# Patient Record
Sex: Female | Born: 1990 | Race: Black or African American | Hispanic: No | Marital: Single | Smoking: Former smoker
Health system: Southern US, Community
[De-identification: ages and names within clinical notes are randomized; demographics above are authoritative.]

## PROBLEM LIST (undated history)

## (undated) DIAGNOSIS — Z889 Allergy status to unspecified drugs, medicaments and biological substances status: Secondary | ICD-10-CM

## (undated) DIAGNOSIS — J45909 Unspecified asthma, uncomplicated: Secondary | ICD-10-CM

## (undated) HISTORY — PX: WISDOM TOOTH EXTRACTION: SHX21

---

## 2014-12-24 ENCOUNTER — Emergency Department (HOSPITAL_BASED_OUTPATIENT_CLINIC_OR_DEPARTMENT_OTHER): Admission: EM | Admit: 2014-12-24 | Discharge: 2014-12-24 | Discharge status: Absent without leave | Payer: Self-pay

## 2015-08-30 ENCOUNTER — Emergency Department (HOSPITAL_COMMUNITY): Payer: Medicaid Other

## 2015-08-30 ENCOUNTER — Emergency Department (HOSPITAL_BASED_OUTPATIENT_CLINIC_OR_DEPARTMENT_OTHER)
Admission: EM | Admit: 2015-08-30 | Discharge: 2015-08-31 | Disposition: A | Discharge status: Other Acute Inpatient Hospital | Payer: Medicaid Other | Attending: Emergency Medicine | Admitting: Emergency Medicine

## 2015-08-30 ENCOUNTER — Encounter (HOSPITAL_BASED_OUTPATIENT_CLINIC_OR_DEPARTMENT_OTHER): Payer: Self-pay | Admitting: *Deleted

## 2015-08-30 DIAGNOSIS — Z3A00 Weeks of gestation of pregnancy not specified: Secondary | ICD-10-CM | POA: Diagnosis not present

## 2015-08-30 DIAGNOSIS — O209 Hemorrhage in early pregnancy, unspecified: Secondary | ICD-10-CM

## 2015-08-30 HISTORY — DX: Unspecified asthma, uncomplicated: J45.909

## 2015-08-30 HISTORY — DX: Allergy status to unspecified drugs, medicaments and biological substances: Z88.9

## 2015-08-30 LAB — CBC WITH DIFFERENTIAL/PLATELET
BASOS ABS: 0.1 10*3/uL (ref 0.0–0.1)
Basophils Relative: 1 %
Eosinophils Absolute: 0.1 10*3/uL (ref 0.0–0.7)
Eosinophils Relative: 1 %
HEMATOCRIT: 42.8 % (ref 36.0–46.0)
Hemoglobin: 14.3 g/dL (ref 12.0–15.0)
LYMPHS PCT: 33 %
Lymphs Abs: 1.9 10*3/uL (ref 0.7–4.0)
MCH: 30.2 pg (ref 26.0–34.0)
MCHC: 33.4 g/dL (ref 30.0–36.0)
MCV: 90.5 fL (ref 78.0–100.0)
Monocytes Absolute: 0.7 10*3/uL (ref 0.1–1.0)
Monocytes Relative: 12 %
NEUTROS ABS: 3 10*3/uL (ref 1.7–7.7)
NEUTROS PCT: 53 %
PLATELETS: 277 10*3/uL (ref 150–400)
RBC: 4.73 MIL/uL (ref 3.87–5.11)
RDW: 12.2 % (ref 11.5–15.5)
WBC: 5.6 10*3/uL (ref 4.0–10.5)

## 2015-08-30 LAB — WET PREP, GENITAL
Sperm: NONE SEEN
Trich, Wet Prep: NONE SEEN
WBC WET PREP: NONE SEEN
YEAST WET PREP: NONE SEEN

## 2015-08-30 LAB — URINALYSIS, ROUTINE W REFLEX MICROSCOPIC
Bilirubin Urine: NEGATIVE
Glucose, UA: NEGATIVE mg/dL
Ketones, ur: 15 mg/dL — AB
LEUKOCYTES UA: NEGATIVE
Nitrite: NEGATIVE
PROTEIN: NEGATIVE mg/dL
Specific Gravity, Urine: 1.027 (ref 1.005–1.030)
pH: 6 (ref 5.0–8.0)

## 2015-08-30 LAB — PREGNANCY, URINE: PREG TEST UR: POSITIVE — AB

## 2015-08-30 LAB — URINE MICROSCOPIC-ADD ON: WBC, UA: NONE SEEN WBC/hpf (ref 0–5)

## 2015-08-30 LAB — ABO/RH: ABO/RH(D): A POS

## 2015-08-30 LAB — HCG, QUANTITATIVE, PREGNANCY: HCG, BETA CHAIN, QUANT, S: 1585 m[IU]/mL — AB (ref <5)

## 2015-08-30 NOTE — ED Notes (Signed)
Pt had +preg test on 11/2 with LMP 07/24/2015 at [facility]- reports she woke up today with tan colored discharge- when she went to BR this afternoon around 1700 she wiped and had BRB- Pt is wearing a pantiliner that she has not had to change yet- denies pain

## 2015-08-30 NOTE — ED Notes (Signed)
Pt reports went to MD and found out she was 3weeks 1 day pregnant on Nov 2nd.  Woke up today with tan discharge and then around 5pm today started having bright red blood.  Reports cramping.

## 2015-08-30 NOTE — ED Provider Notes (Signed)
CSN: [id number]     Arrival date & time 08/30/15  1944 History  By signing my name below, I, Octavia Heir, attest that this documentation has been prepared under the direction and in the presence of Tilden Fossa, MD. Electronically Signed: Octavia Heir, ED Scribe. 08/30/2015. 8:52 PM.      Chief Complaint  Patient presents with  . Vaginal Bleeding    pregnant     The history is provided by the patient. No language interpreter was used.   HPI Comments: Carol Willis is a 24 y.o. female who is [redacted]wks pregnant presents to the Emergency Department complaining of constant, gradual worsening vaginal bleeding onset this morning. Pt reports she woke up this evening with tan colored discharge that progressively turned into bright red blood. She reports having some abdominal cramping this evening when giving a urine sample. This is the pt's second pregnancy. Pt states the first day of her last period was 10/11. Pt denies vomiting and diarrhea.  Past Medical History  Diagnosis Date  . Asthma   . Multiple allergies    Past Surgical History  Procedure Laterality Date  . Wisdom tooth extraction     No family history on file. Social History  Substance Use Topics  . Smoking status: Former Smoker    Quit date: 08/11/2015  . Smokeless tobacco: Never Used  . Alcohol Use: No   OB History    Gravida Para Term Preterm AB TAB SAB Ectopic Multiple Living   1              Review of Systems  Gastrointestinal: Positive for abdominal pain. Negative for vomiting and diarrhea.  Genitourinary: Positive for vaginal bleeding.  All other systems reviewed and are negative.     Allergies  Review of patient's allergies indicates no known allergies.  Home Medications   Prior to Admission medications   Not on File   Triage vitals: BP 117/77 mmHg  Pulse 83  Temp(Src) 98.6 F (37 C) (Oral)  Resp 16  Ht  (1.499 m)  Wt 175 lb (79.379 kg)  BMI 35.33 kg/m2  SpO2 100%  LMP  07/19/2015 Physical Exam  Constitutional: She is oriented to person, place, and time. She appears well-developed and well-nourished.  HENT:  Head: Normocephalic and atraumatic.  Cardiovascular: Normal rate and regular rhythm.   No murmur heard. Pulmonary/Chest: Effort normal and breath sounds normal. No respiratory distress.  Abdominal: Soft. There is no tenderness. There is no rebound and no guarding.  Genitourinary:  Pelvic exam: moderate amount of blood, os closed   Musculoskeletal: She exhibits no edema or tenderness.  Neurological: She is alert and oriented to person, place, and time.  Skin: Skin is warm and dry.  Psychiatric: She has a normal mood and affect. Her behavior is normal.  Nursing note and vitals reviewed.   ED Course  Procedures  DIAGNOSTIC STUDIES: Oxygen Saturation is 100% on RA, normal by my interpretation.  COORDINATION OF CARE:  8:49 PM Discussed treatment plan which includes pelvic exam and urinalysis with pt at bedside and pt agreed to plan.  Labs Review Labs Reviewed  PREGNANCY, URINE - Abnormal; Notable for the following:    Preg Test, Ur POSITIVE (*)    All other components within normal limits    Imaging Review No results found. I have personally reviewed and evaluated these images and lab results as part of my medical decision-making.   EKG Interpretation None      MDM   Final  diagnoses:  Vaginal bleeding in pregnancy, first trimester   Pt here for evaluation of vaginal bleeding, LMP 07/19/15.  There is mild to moderate blood present on pelvic exam with no clots, no tissue on OS, no CMT.  [facility] to further evaluate.  Pt instructed to present directly to [facility] for further work up.     Tilden FossaElizabeth Randal Yepiz, MD 08/31/15 [phone]

## 2015-08-31 NOTE — ED Provider Notes (Signed)
CSN: [id number]     Arrival date & time 08/30/15  1944 History   First MD Initiated Contact with Patient 08/30/15 2039     Chief Complaint  Patient presents with  . Vaginal Bleeding    pregnant     (Consider location/radiation/quality/duration/timing/severity/associated sxs/prior Treatment) Patient is a 24 y.o. female presenting with vaginal bleeding. The history is provided by the patient. No language interpreter was used.  Vaginal Bleeding Severity:  Moderate Onset quality:  Gradual Timing:  Constant Progression:  Worsening Chronicity:  New Menstrual history:  Missed period Possible pregnancy: no   Relieved by:  Nothing Worsened by:  Nothing tried Ineffective treatments:  None tried Associated symptoms: no abdominal pain    [facility] and sent here for an ultrasound. Past Medical History  Diagnosis Date  . Asthma   . Multiple allergies    Past Surgical History  Procedure Laterality Date  . Wisdom tooth extraction     No family history on file. Social History  Substance Use Topics  . Smoking status: Former Smoker    Quit date: 08/11/2015  . Smokeless tobacco: Never Used  . Alcohol Use: No   OB History    Gravida Para Term Preterm AB TAB SAB Ectopic Multiple Living   1              Review of Systems  Gastrointestinal: Negative for abdominal pain.  Genitourinary: Positive for vaginal bleeding.  All other systems reviewed and are negative.     Allergies  Review of patient's allergies indicates no known allergies.  Home Medications   Prior to Admission medications   Medication Sig Start Date End Date Taking? Authorizing Provider  Prenatal Vit-Fe Fumarate-FA (PRENATAL MULTIVITAMIN) TABS tablet Take 1 tablet by mouth daily at 12 noon.   Yes Historical Provider, MD   BP 142/85 mmHg  Pulse 69  Temp(Src) 98.1 F (36.7 C) (Oral)  Resp 14  Ht  (1.499 m)  Wt 79.379 kg  BMI 35.33 kg/m2  SpO2 100%  LMP 07/19/2015 Physical  Exam  Constitutional: She is oriented to person, place, and time. She appears well-developed and well-nourished.  HENT:  Head: Normocephalic.  Eyes: EOM are normal.  Neck: Normal range of motion.  Cardiovascular: Normal rate.   Pulmonary/Chest: Effort normal.  Abdominal: Soft. She exhibits no distension.  Musculoskeletal: Normal range of motion.  Neurological: She is alert and oriented to person, place, and time.  Psychiatric: She has a normal mood and affect.  Nursing note and vitals reviewed.   ED Course  Procedures (including critical care time) Labs Review Labs Reviewed  WET PREP, GENITAL - Abnormal; Notable for the following:    Clue Cells Wet Prep HPF POC PRESENT (*)    All other components within normal limits  PREGNANCY, URINE - Abnormal; Notable for the following:    Preg Test, Ur POSITIVE (*)    All other components within normal limits  URINALYSIS, ROUTINE W REFLEX MICROSCOPIC (NOT AT [facility]) - Abnormal; Notable for the following:    Hgb urine dipstick MODERATE (*)    Ketones, ur 15 (*)    All other components within normal limits  HCG, QUANTITATIVE, PREGNANCY - Abnormal; Notable for the following:    hCG, Beta Chain, Quant, S 1585 (*)    All other components within normal limits  URINE MICROSCOPIC-ADD ON - Abnormal; Notable for the following:    Squamous Epithelial / LPF 0-5 (*)    Bacteria, UA  RARE (*)    All other components within normal limits  CBC WITH DIFFERENTIAL/PLATELET  HIV ANTIBODY (ROUTINE TESTING)  ABO/RH  GC/CHLAMYDIA PROBE AMP ([place]) NOT AT [facility]    Imaging Review US Ob Comp Less 14 Wks  08/30/2015  CLINICAL DATA:  Positive pregnancy test. Cramping. Bright red blood when wiping. Estimated gestational age by LMP is 6 weeks 0 days. Quantitative beta HCG is 1,585. EXAM: OBSTETRIC <14 WK [place] AND TRANSVAGINAL OB US TECHNIQUE: Both transabdominal and transvaginal ultrasound examinations were performed for complete evaluation of the gestation as  well as the maternal uterus, adnexal regions, and pelvic cul-de-sac. Transvaginal technique was performed to assess early pregnancy. COMPARISON:  None. FINDINGS: Intrauterine gestational sac: No intrauterine gestational sac identified. Yolk sac:  Not identified Embryo:  Not identified Cardiac Activity: Not identified. Maternal uterus/adnexae: The uterus is anteverted and measures 7.9 by 3.8 by 6 cm. Endometrial stripe thickness appears normal at 5 mm. No abnormal endometrial fluid collections. Small nabothian cysts in the cervix. Exophytic lesions are demonstrated off of the uterine fundus, right measuring 2.2 x 2.2 x 2.7 cm and left measuring 2.7 x 2 x 1.9 cm. These likely represent exophytic fibroids. Both ovaries are identified. Right ovary measures 3.6 x 2.3 x 2.5 cm. Left ovary measures 2.8 x 1.8 x 2.4 cm. Normal follicular changes are demonstrated. No abnormal adnexal masses. Small amount of free fluid in the pelvis. IMPRESSION: No intrauterine gestational sac, yolk sac, or fetal pole identified. Differential considerations include intrauterine pregnancy too early to be sonographically visualized, missed abortion, or ectopic pregnancy. Followup ultrasound is recommended in 10-14 days for further evaluation. 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 Small amount of free fluid in the pelvis. Electronically Signed   By: Burman Nieves M.D.   On: 08/30/2015 23:47   US Ob Transvaginal  08/30/2015  CLINICAL DATA:  Positive pregnancy test. Cramping. Bright red blood when wiping. Estimated gestational age by LMP is 6 weeks 0 days. Quantitative beta HCG is 1,585. EXAM: OBSTETRIC <14 WK [place] AND TRANSVAGINAL OB US TECHNIQUE: Both transabdominal and transvaginal ultrasound examinations were performed for complete evaluation of the gestation as well as the maternal uterus, adnexal regions, and pelvic cul-de-sac. Transvaginal technique was performed to assess early pregnancy. COMPARISON:  None. FINDINGS:  Intrauterine gestational sac: No intrauterine gestational sac identified. Yolk sac:  Not identified Embryo:  Not identified Cardiac Activity: Not identified. Maternal uterus/adnexae: The uterus is anteverted and measures 7.9 by 3.8 by 6 cm. Endometrial stripe thickness appears normal at 5 mm. No abnormal endometrial fluid collections. Small nabothian cysts in the cervix. Exophytic lesions are demonstrated off of the uterine fundus, right measuring 2.2 x 2.2 x 2.7 cm and left measuring 2.7 x 2 x 1.9 cm. These likely represent exophytic fibroids. Both ovaries are identified. Right ovary measures 3.6 x 2.3 x 2.5 cm. Left ovary measures 2.8 x 1.8 x 2.4 cm. Normal follicular changes are demonstrated. No abnormal adnexal masses. Small amount of free fluid in the pelvis. IMPRESSION: No intrauterine gestational sac, yolk sac, or fetal pole identified. Differential considerations include intrauterine pregnancy too early to be sonographically visualized, missed abortion, or ectopic pregnancy. Followup ultrasound is recommended in 10-14 days for further evaluation. 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 Small amount of free fluid in the pelvis. Electronically Signed   By: Burman Nieves M.D.   On: 08/30/2015 23:47   I have personally reviewed and evaluated these images and lab results as part of my medical decision-making.  EKG Interpretation None      MDM Ultrasound shows no gestation sac. Radiologist advised need for 10 day repeat ultrasound.  Pt is A positive.    Final diagnoses:  Vaginal bleeding in pregnancy, first trimester    [facility]   [facility].  [facility] any problems.    Lonia SkinnerLeslie K Prices ForkSofia, PA-C 08/31/15 0016  Laurence Spatesachel Morgan Little, MD 08/31/15 [phone]

## 2015-08-31 NOTE — Discharge Instructions (Signed)

## 2015-09-01 LAB — HIV ANTIBODY (ROUTINE TESTING W REFLEX): HIV Screen 4th Generation wRfx: NONREACTIVE

## 2015-09-01 LAB — GC/CHLAMYDIA PROBE AMP (~~LOC~~) NOT AT ARMC
Chlamydia: NEGATIVE
Neisseria Gonorrhea: NEGATIVE

## 2016-02-07 IMAGING — US US OB TRANSVAGINAL
1 series · 13 of 28 positions shown · non-contrast
Comparison: None.

CLINICAL DATA: Positive pregnancy test. Cramping. Bright red blood
when wiping. Estimated gestational age by LMP is 6 weeks 0 days.
Quantitative beta HCG is [DATE].

EXAM:
OBSTETRIC <14 WK US AND TRANSVAGINAL OB US
TECHNIQUE: Both transabdominal and transvaginal ultrasound examinations were
performed for complete evaluation of the gestation as well as the
maternal uterus, adnexal regions, and pelvic cul-de-sac.
Transvaginal technique was performed to assess early pregnancy.

[Series 1: us ob transvaginal · 0.20mm/px · 64 acquisitions, 13 frames shown]
[im 3/64]
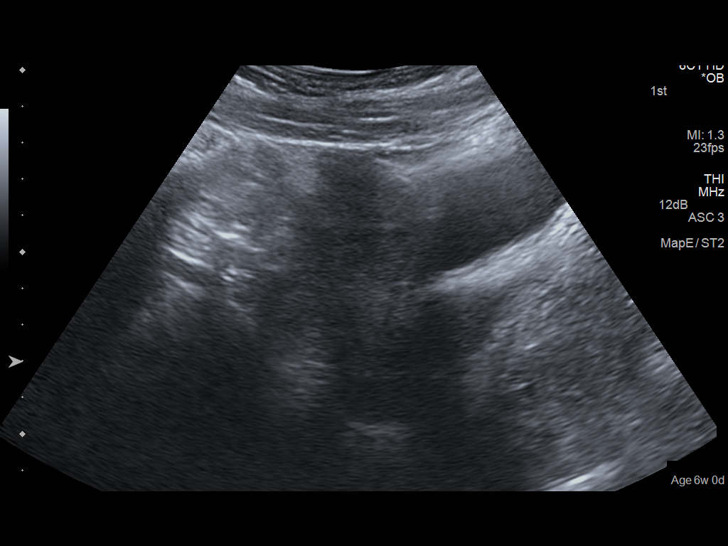
[im 8/64]
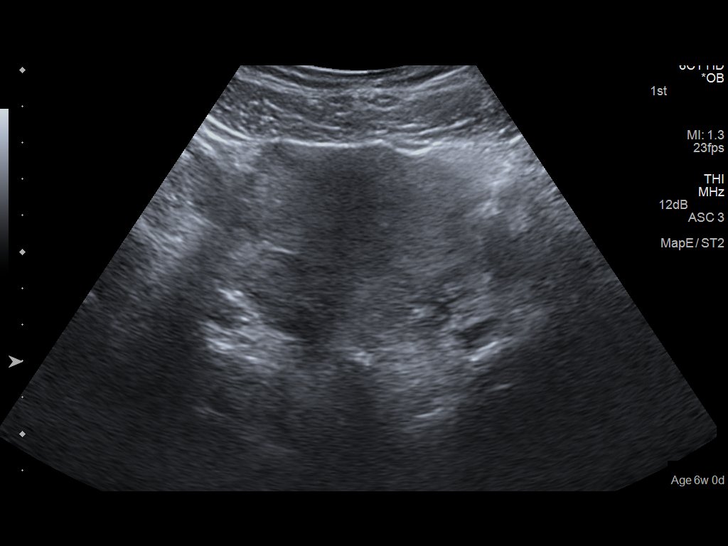
[im 12/64]
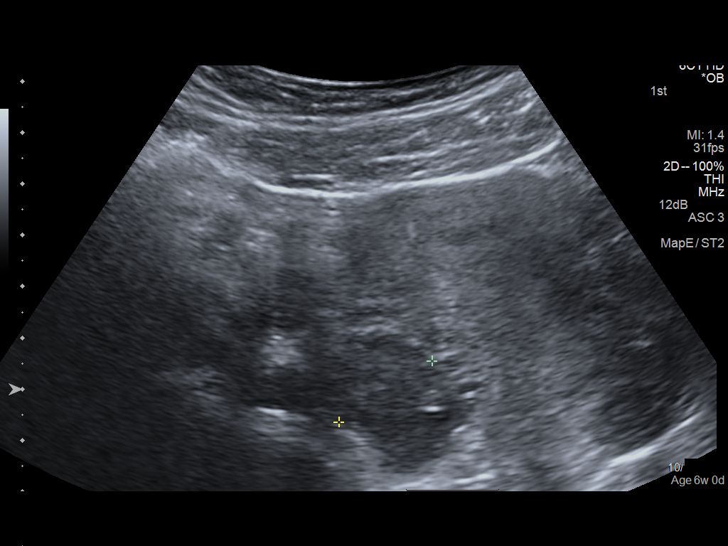
[im 17/64]
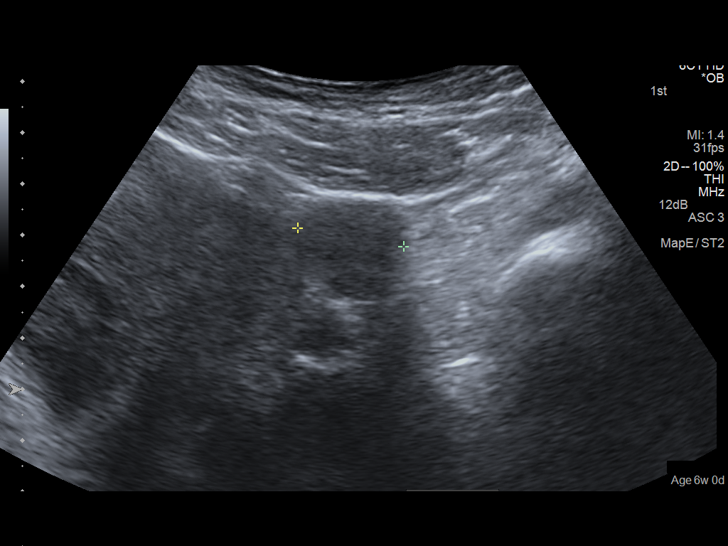
[im 22/64]
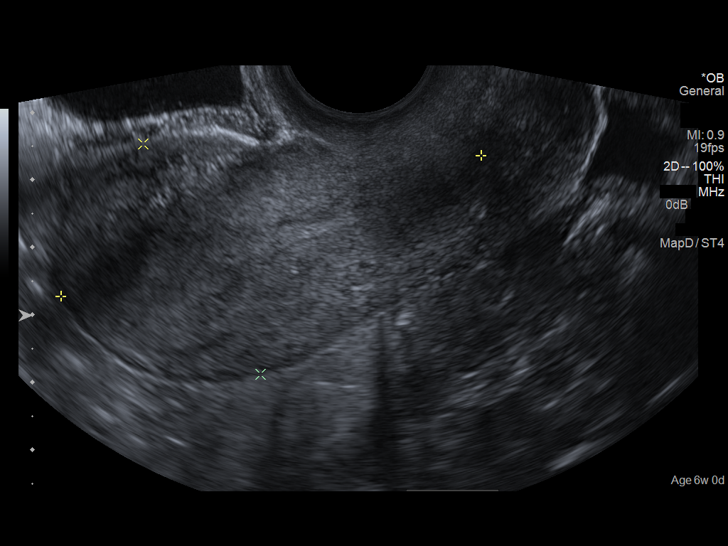
[im 26/64]
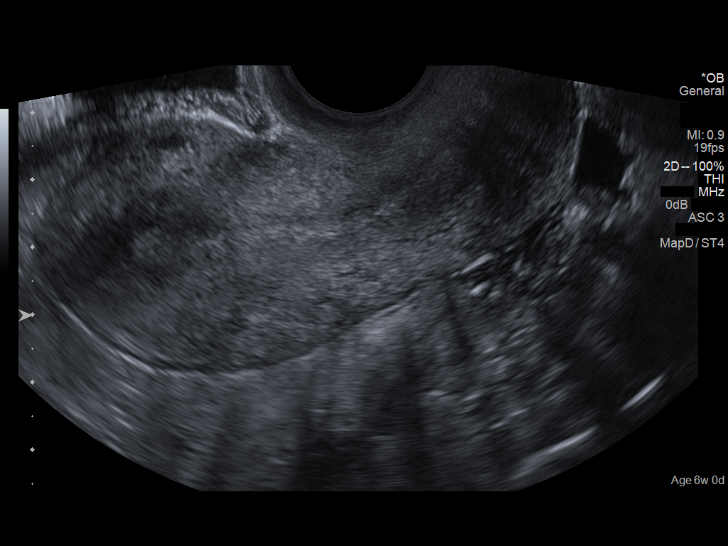
[im 33/64]
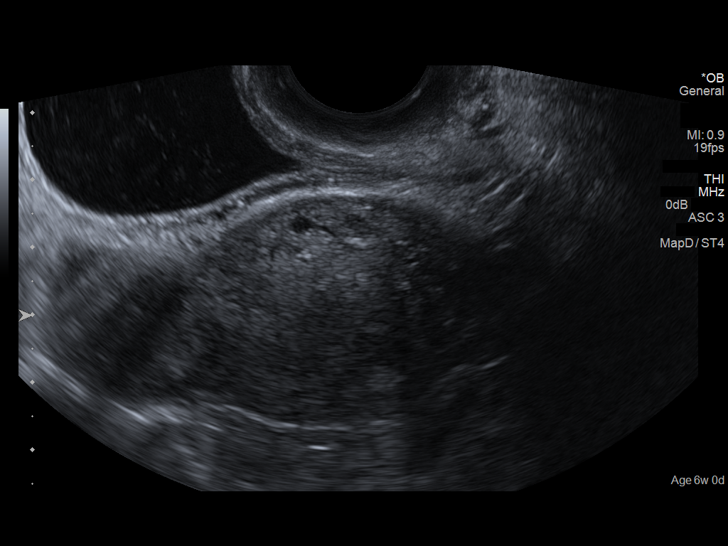
[im 38/64]
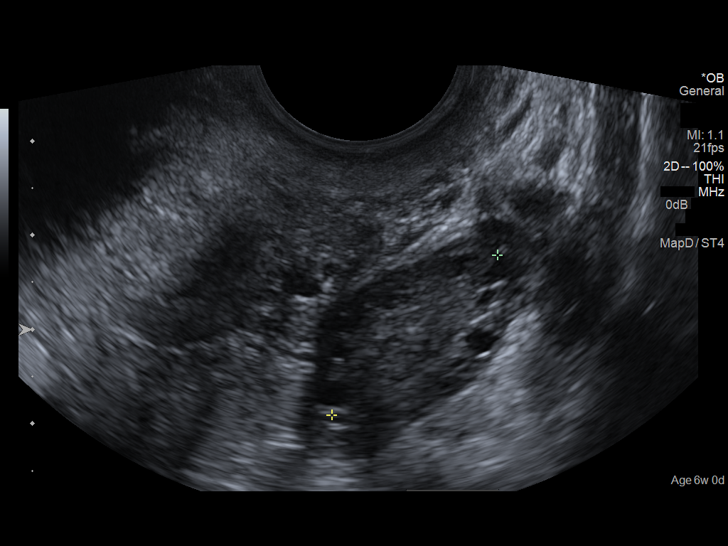
[im 43/64]
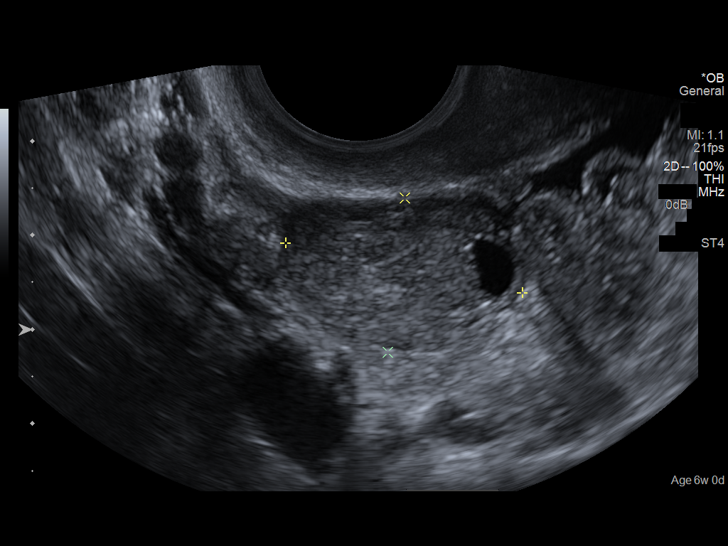
[im 47/64]
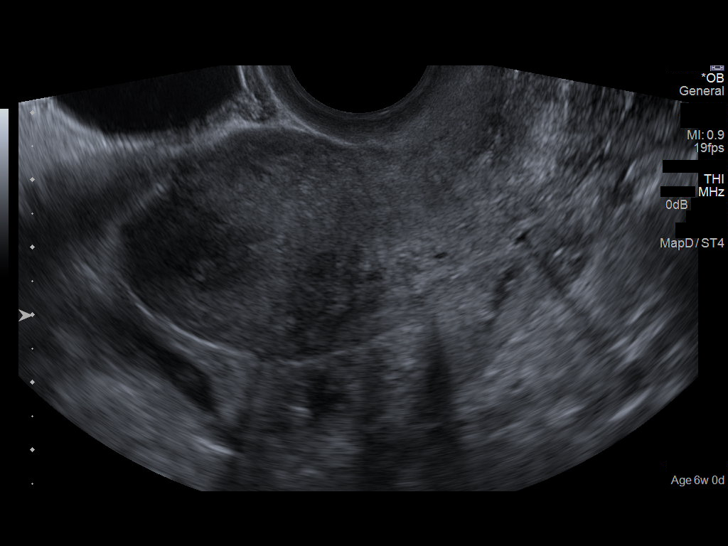
[im 52/64]
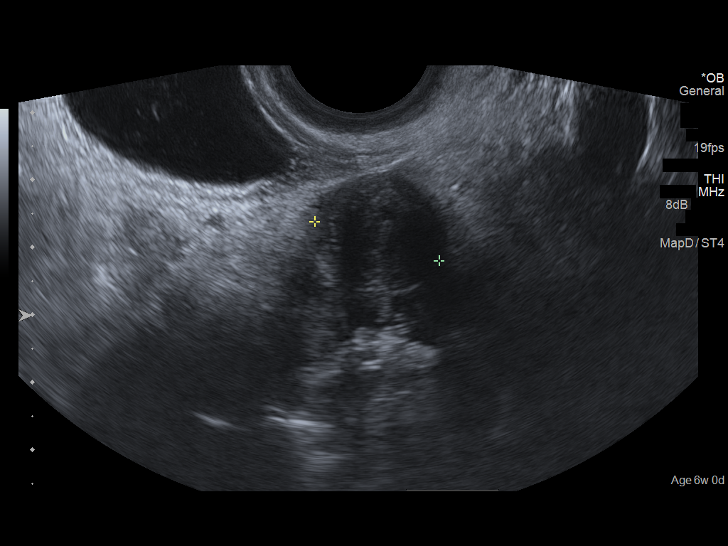
[im 57/64]
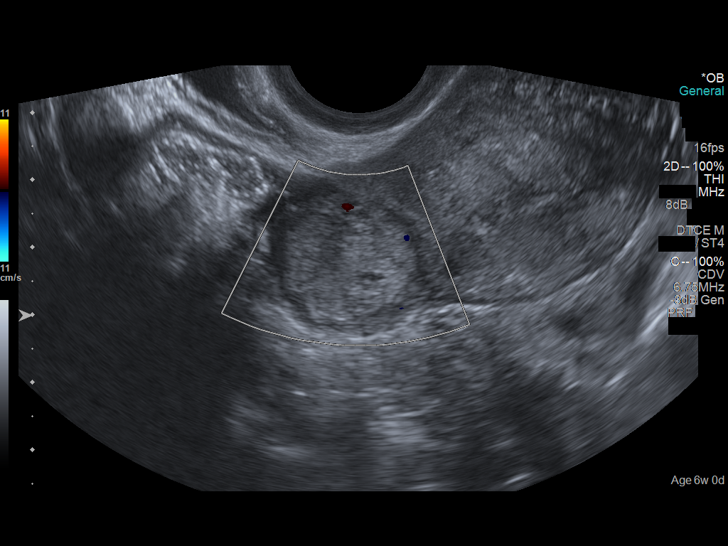
[im 61/64]
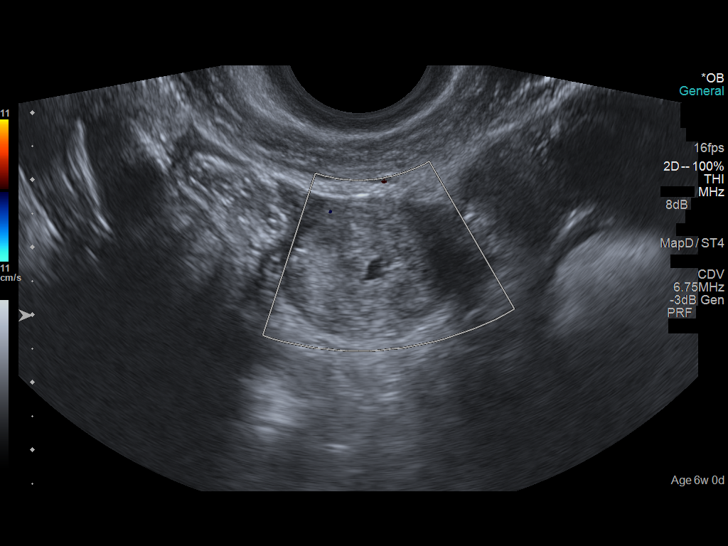

[13 of 28 positions shown; findings below may reference images not displayed]

FINDINGS: Intrauterine gestational sac: No intrauterine gestational sac
identified.

Yolk sac:  Not identified

Embryo:  Not identified

Cardiac Activity: Not identified.

Maternal uterus/adnexae: The uterus is anteverted and measures
by 3.8 by 6 cm. Endometrial stripe thickness appears normal at 5 mm.
No abnormal endometrial fluid collections. Small nabothian cysts in
the cervix. Exophytic lesions are demonstrated off of the uterine
fundus, right measuring 2.2 x 2.2 x 2.7 cm and left measuring 2.7 x
2 x 1.9 cm. These likely represent exophytic fibroids.

Both ovaries are identified. Right ovary measures 3.6 x 2.3 x
cm. Left ovary measures 2.8 x 1.8 x 2.4 cm. Normal follicular
changes are demonstrated. No abnormal adnexal masses.

Small amount of free fluid in the pelvis.
IMPRESSION: No intrauterine gestational sac, yolk sac, or fetal pole identified.
Differential considerations include intrauterine pregnancy too early
to be sonographically visualized, missed abortion, or ectopic
pregnancy. Followup ultrasound is recommended in 10-14 days for
further evaluation. Probable bilateral exophytic uterine fibroids.
Small amount of free fluid in the pelvis.

## 2016-10-29 ENCOUNTER — Encounter (HOSPITAL_BASED_OUTPATIENT_CLINIC_OR_DEPARTMENT_OTHER): Payer: Self-pay | Admitting: *Deleted

## 2016-10-29 ENCOUNTER — Emergency Department (HOSPITAL_BASED_OUTPATIENT_CLINIC_OR_DEPARTMENT_OTHER)
Admission: EM | Admit: 2016-10-29 | Discharge: 2016-10-30 | Disposition: A | Discharge status: Home / Self Care | Payer: Medicaid Other | Attending: Emergency Medicine | Admitting: Emergency Medicine

## 2016-10-29 DIAGNOSIS — Z3A30 30 weeks gestation of pregnancy: Secondary | ICD-10-CM | POA: Diagnosis not present

## 2016-10-29 DIAGNOSIS — Z87891 Personal history of nicotine dependence: Secondary | ICD-10-CM | POA: Diagnosis not present

## 2016-10-29 DIAGNOSIS — R109 Unspecified abdominal pain: Secondary | ICD-10-CM

## 2016-10-29 DIAGNOSIS — J45909 Unspecified asthma, uncomplicated: Secondary | ICD-10-CM | POA: Diagnosis not present

## 2016-10-29 DIAGNOSIS — R1030 Lower abdominal pain, unspecified: Secondary | ICD-10-CM | POA: Diagnosis not present

## 2016-10-29 DIAGNOSIS — O26893 Other specified pregnancy related conditions, third trimester: Secondary | ICD-10-CM | POA: Diagnosis present

## 2016-10-29 DIAGNOSIS — M545 Low back pain: Secondary | ICD-10-CM | POA: Insufficient documentation

## 2016-10-29 LAB — URINALYSIS, ROUTINE W REFLEX MICROSCOPIC
Bilirubin Urine: NEGATIVE
GLUCOSE, UA: NEGATIVE mg/dL
HGB URINE DIPSTICK: NEGATIVE
Ketones, ur: NEGATIVE mg/dL
Nitrite: NEGATIVE
Protein, ur: NEGATIVE mg/dL
SPECIFIC GRAVITY, URINE: 1.027 (ref 1.005–1.030)
pH: 6.5 (ref 5.0–8.0)

## 2016-10-29 LAB — URINALYSIS, MICROSCOPIC (REFLEX): RBC / HPF: NONE SEEN RBC/hpf (ref 0–5)

## 2016-10-29 MED ORDER — SODIUM CHLORIDE 0.9 % IV BOLUS (SEPSIS)
1000.0000 mL | Freq: Once | INTRAVENOUS | Status: AC
Start: 2016-10-30 — End: 2016-10-30
  Administered 2016-10-29: 1000 mL via INTRAVENOUS

## 2016-10-29 MED ORDER — ACETAMINOPHEN 500 MG PO TABS
1000.0000 mg | ORAL_TABLET | Freq: Once | ORAL | Status: AC
Start: 2016-10-30 — End: 2016-10-30
  Administered 2016-10-30: 1000 mg via ORAL
  Filled 2016-10-29: qty 2

## 2016-10-29 NOTE — ED Provider Notes (Signed)
By signing my name below, I, Carol Willis, attest that this documentation has been prepared under the direction and in the presence of Kristen N Ward, DO. Electronically signed, Carol Willis, ED Scribe. 10/29/16. 11:47 PM.  TIME SEEN: 11:43 AM  CHIEF COMPLAINT: Abdominal pain  HPI:  HPI Comments: Carol Willis is a 26 y.o. female G2P1 who presents to the Emergency Department complaining of gradual onset, worsening central abdominal pain that started this morning upon waking. Pt states that this pain has gradually worsened throughout the day today. [facility]. This is the pt's second pregnancy. No complications with her first pregnancy. She is due April 2nd. She is 25 weeks and 5 days based on this due date. No fever, chills, nausea, vomiting, diarrhea. No dysuria, hematuria. No abnormal vaginal bleeding or discharge. Has not noticed any contractions. Has been feeling her baby move. Has an appointment with her OB/GYN on 11/01/16. No complications with this pregnancy.  ROS: See HPI Constitutional: no fever  Eyes: no drainage  ENT: no runny nose   Cardiovascular:  no chest pain  Resp: no SOB  GI: no vomiting GU: no dysuria Integumentary: no rash  Allergy: no hives  Musculoskeletal: no leg swelling  Neurological: no slurred speech ROS otherwise negative  PAST MEDICAL HISTORY/PAST SURGICAL HISTORY:  Past Medical History:  Diagnosis Date  . Asthma   . Multiple allergies     MEDICATIONS:  Prior to Admission medications   Medication Sig Start Date End Date Taking? Authorizing Provider  Prenatal Vit-Fe Fumarate-FA (PRENATAL MULTIVITAMIN) TABS tablet Take 1 tablet by mouth daily at 12 noon.    Historical Provider, MD    ALLERGIES:  No Known Allergies  SOCIAL HISTORY:  Social History  Substance Use Topics  . Smoking status: Former Smoker    Quit date: 08/11/2015  . Smokeless tobacco: Never Used  . Alcohol use No    FAMILY HISTORY: History reviewed. No pertinent  family history.  EXAM: BP 96/62 (BP Location: Left Arm)   Pulse 99   Temp 98.1 F (36.7 C) (Oral)   Resp 16   Ht 4\' 11"  (1.499 m)   Wt 216 lb (98 kg)   LMP 03/29/2016   SpO2 99%   BMI 43.63 kg/m  CONSTITUTIONAL: Alert and oriented and responds appropriately to questions. Well-appearing; well-nourished HEAD: Normocephalic EYES: Conjunctivae clear, PERRL, EOMI ENT: normal nose; no rhinorrhea; moist mucous membranes NECK: Supple, no meningismus, no nuchal rigidity, no LAD  CARD: RRR; S1 and S2 appreciated; no murmurs, no clicks, no rubs, no gallops RESP: Normal chest excursion without splinting or tachypnea; breath sounds clear and equal bilaterally; no wheezes, no rhonchi, no rales, no hypoxia or respiratory distress, speaking full sentences ABD/GI: Normal bowel sounds; gravid uterus approximately 10 cm above the level of umbilicus, soft, non-tender, no rebound, no guarding, no peritoneal signs, no hepatosplenomegaly.  GU:  Normal external genitalia, cervix is thick, closed and high. No significant vaginal discharge or bleeding. No leaking fluid. BACK:  The back appears normal and is non-tender to palpation, there is no CVA tenderness EXT: Normal ROM in all joints; non-tender to palpation; no edema; normal capillary refill; no cyanosis, no calf tenderness or swelling    SKIN: Normal color for age and race; warm; no rash NEURO: Moves all extremities equally, sensation to light touch intact diffusely, cranial nerves II through XII intact, normal speech PSYCH: The patient's mood and manner are appropriate. Grooming and personal hygiene are appropriate.  MEDICAL DECISION MAKING:  Patient here with abdominal and lower back pain while pregnant. Urine shows small leukocytes but also a few bacteria and many squamous cells. Suspect dirty catch. No other sign of infection. She has been monitored on tocometry and we see no sign of contractions, fetal abnormality. There is normal fetal movement and  normal fetal heart rate. Cervix is closed, thick and high without vaginal bleeding, significant discharge or leaking of fluid. She has follow-up with her OB/GYN this week. Have given her Tylenol and IV fluids and she reports feeling much better. Recommended taking Tylenol at home, increasing her fluid intake. Discussed at length return precautions. Patient has been cleared by the rapid response OB team. We appreciate their help. I feel she is safe for discharge home. Discussed return precautions with patient.  At this time, I do not feel there is any life-threatening condition present. I have reviewed and discussed all results (EKG, imaging, lab, urine as appropriate) and exam findings with patient/family. I have reviewed nursing notes and appropriate previous records.  I feel the patient is safe to be discharged home without further emergent workup and can continue workup as an outpatient as needed. Discussed usual and customary return precautions. Patient/family verbalize understanding and are comfortable with this plan.  Outpatient follow-up has been provided. All questions have been answered.   I personally performed the services described in this documentation, which was scribed in my presence. The recorded information has been reviewed and is accurate.    Carol Maw Ward, DO 10/30/16 [phone]

## 2016-10-29 NOTE — Progress Notes (Signed)
Reported to Dr. Sundra AlandSchank pt arrival to [facility]. Pt on monitor and FHR currently 145.

## 2016-10-29 NOTE — ED Triage Notes (Addendum)
Pt c/o abd pain and pressure  30 weeks preg , pt denies vaginal bleeding or fluid leaking , pts OBGYN is [facility]

## 2016-10-29 NOTE — ED Notes (Signed)
Could not reach RR nurse.  Spoke with L&D charge nurse. Gave pt information.  She will notify RR nurse and she will call me back. Pt in external monitor at this time.

## 2016-10-30 ENCOUNTER — Encounter: Payer: Self-pay | Admitting: Emergency Medicine

## 2016-10-30 NOTE — Discharge Instructions (Signed)
You may take Tylenol 1000 mg every 6 hours as needed for pain. Please avoid NSAIDs such as ibuprofen, aspirin and Aleve. You may increase your fluid intake at home which can help with abdominal pain. If you have increasing pain, feel like you're having contractions, begin leaking fluid or have vaginal bleeding, please return to the hospital immediately. Please follow-up with your OB/GYN this week.

## 2016-10-30 NOTE — Progress Notes (Signed)
Dr Genevie AnnSchenk called to review strip; orders given for OB clearance; no contractions noted and EFM reactive; ED RN notified of orders at this time

## 2016-10-30 NOTE — ED Notes (Signed)
OB rapid response Christy,RN states pt is cleared from OB stand point, can be discharged and f/u with her OBGYN in the morning. EDP Ward notified

## 2017-06-28 DIAGNOSIS — J45909 Unspecified asthma, uncomplicated: Secondary | ICD-10-CM | POA: Insufficient documentation

## 2017-06-28 DIAGNOSIS — H1032 Unspecified acute conjunctivitis, left eye: Secondary | ICD-10-CM | POA: Insufficient documentation

## 2017-06-28 DIAGNOSIS — Z87891 Personal history of nicotine dependence: Secondary | ICD-10-CM | POA: Insufficient documentation

## 2017-06-28 DIAGNOSIS — H5712 Ocular pain, left eye: Secondary | ICD-10-CM | POA: Diagnosis present

## 2017-06-29 ENCOUNTER — Encounter (HOSPITAL_BASED_OUTPATIENT_CLINIC_OR_DEPARTMENT_OTHER): Payer: Self-pay | Admitting: Adult Health

## 2017-06-29 ENCOUNTER — Emergency Department (HOSPITAL_BASED_OUTPATIENT_CLINIC_OR_DEPARTMENT_OTHER)
Admission: EM | Admit: 2017-06-29 | Discharge: 2017-06-29 | Disposition: A | Discharge status: Home / Self Care | Payer: Medicaid Other | Attending: Emergency Medicine | Admitting: Emergency Medicine

## 2017-06-29 DIAGNOSIS — H1032 Unspecified acute conjunctivitis, left eye: Secondary | ICD-10-CM

## 2017-06-29 MED ORDER — TOBRAMYCIN 0.3 % OP SOLN: 2.0000 [drp] | OPHTHALMIC | 0 refills | Status: DC

## 2017-06-29 NOTE — Discharge Instructions (Signed)
Return if any problems.

## 2017-06-29 NOTE — ED Triage Notes (Signed)
Presents with left eye redness, itching and discharge that began 5 days ago. Recent contact with someone whio has conjunctivits

## 2017-06-29 NOTE — ED Provider Notes (Signed)
[facility] Provider Note   CSN: [id number] Arrival date & time: 06/28/17  2343     History   Chief Complaint Chief Complaint  Patient presents with  . Conjunctivitis    HPI Carol Willis is a 26 y.o. female.  The history is provided by the patient. No language interpreter was used.  Conjunctivitis  This is a new problem. The problem occurs constantly. The problem has been gradually worsening. Nothing aggravates the symptoms. Nothing relieves the symptoms. She has tried nothing for the symptoms. The treatment provided no relief.   Pt complains of itching to her eye.  Pt reports drainage from her eye.  Past Medical History:  Diagnosis Date  . Asthma   . Multiple allergies     There are no active problems to display for this patient.   Past Surgical History:  Procedure Laterality Date  . WISDOM TOOTH EXTRACTION      OB History    Gravida Para Term Preterm AB Living   2       1     SAB TAB Ectopic Multiple Live Births   1     1         Home Medications    Prior to Admission medications   Medication Sig Start Date End Date Taking? Authorizing Provider  Prenatal Vit-Fe Fumarate-FA (PRENATAL MULTIVITAMIN) TABS tablet Take 1 tablet by mouth daily at 12 noon.    [provider]    Family History History reviewed. No pertinent family history.  Social History Social History  Substance Use Topics  . Smoking status: Former Smoker    Quit date: 08/11/2015  . Smokeless tobacco: Never Used  . Alcohol use No     Allergies   Patient has no known allergies.   Review of Systems Review of Systems  All other systems reviewed and are negative.    Physical Exam Updated Vital Signs BP 119/77 (BP Location: Right Arm)   Pulse 88   Temp 98.4 F (36.9 C) (Oral)   Resp 18   Ht  (1.499 m)   Wt 97.5 kg (215 lb)   LMP 03/26/2017 (Exact Date)   SpO2 100%   Breastfeeding? No   BMI 43.42 kg/m   Physical Exam  Constitutional: She  appears well-developed and well-nourished.  HENT:  Head: Normocephalic.  Eyes: EOM are normal.  Injected left conjunctiva.    Neck: Normal range of motion.  Cardiovascular: Normal rate.   Pulmonary/Chest: Effort normal.  Musculoskeletal: Normal range of motion.  Skin: Skin is warm.  Psychiatric: She has a normal mood and affect.  Nursing note and vitals reviewed.    ED Treatments / Results  Labs (all labs ordered are listed, but only abnormal results are displayed) Labs Reviewed - No data to display  EKG  EKG Interpretation None       Radiology No results found.  Procedures Procedures (including critical care time)  Medications Ordered in ED Medications - No data to display   Initial Impression / Assessment and Plan / ED Course  I have reviewed the triage vital signs and the nursing notes.  Pertinent labs & imaging results that were available during my care of the patient were reviewed by me and considered in my medical decision making (see chart for details).       Final Clinical Impressions(s) / ED Diagnoses   Final diagnoses:  Acute conjunctivitis of left eye, unspecified acute conjunctivitis type    New Prescriptions New Prescriptions  TOBRAMYCIN (TOBREX) 0.3 % OPHTHALMIC SOLUTION    Place 2 drops into the left eye every 4 (four) hours.  An After Visit Summary was printed and given to the patient. No orders of the defined types were placed in this encounter.    Elson Areas, [place] 06/29/17 0101    Palumbo, April, MD 06/29/17 [phone]

## 2017-06-29 NOTE — ED Notes (Signed)
Pt verbalizes understanding of d/c instructions and denies any further needs at this time. 

## 2017-07-01 ENCOUNTER — Emergency Department (HOSPITAL_BASED_OUTPATIENT_CLINIC_OR_DEPARTMENT_OTHER)
Admission: EM | Admit: 2017-07-01 | Discharge: 2017-07-01 | Disposition: A | Discharge status: Home / Self Care | Payer: Medicaid Other | Attending: Emergency Medicine | Admitting: Emergency Medicine

## 2017-07-01 ENCOUNTER — Encounter (HOSPITAL_BASED_OUTPATIENT_CLINIC_OR_DEPARTMENT_OTHER): Payer: Self-pay | Admitting: *Deleted

## 2017-07-01 DIAGNOSIS — H5711 Ocular pain, right eye: Secondary | ICD-10-CM | POA: Diagnosis present

## 2017-07-01 DIAGNOSIS — H1031 Unspecified acute conjunctivitis, right eye: Secondary | ICD-10-CM

## 2017-07-01 DIAGNOSIS — H10021 Other mucopurulent conjunctivitis, right eye: Secondary | ICD-10-CM | POA: Insufficient documentation

## 2017-07-01 DIAGNOSIS — J45909 Unspecified asthma, uncomplicated: Secondary | ICD-10-CM | POA: Insufficient documentation

## 2017-07-01 DIAGNOSIS — Z87891 Personal history of nicotine dependence: Secondary | ICD-10-CM | POA: Diagnosis not present

## 2017-07-01 MED ORDER — OLOPATADINE HCL 0.1 % OP SOLN: 1.0000 [drp] | Freq: Two times a day (BID) | OPHTHALMIC | 12 refills | Status: AC

## 2017-07-01 MED ORDER — TOBRAMYCIN 0.3 % OP SOLN: 2.0000 [drp] | OPHTHALMIC | 0 refills | Status: AC

## 2017-07-01 NOTE — ED Provider Notes (Signed)
[facility]   CSN: [id number] Arrival date & time: 07/01/17  1908     History   Chief Complaint Chief Complaint  Patient presents with  . Eye Pain    HPI Carol Willis is a 26 y.o. female.  HPI Patient presents to the emergency department withPinkeye in the right.  The patient states she is having pain and her eye was matted shut this morning.  She states she was recently treated for pinkeye in the left eye.  She feels like it spread to the other eye.  Patient states that she has no other complaints at this time.  She denies fever, nausea, vomiting, headache, blurred vision, back pain, neck pain or syncope Past Medical History:  Diagnosis Date  . Asthma   . Multiple allergies     There are no active problems to display for this patient.   Past Surgical History:  Procedure Laterality Date  . WISDOM TOOTH EXTRACTION      OB History    Gravida Para Term Preterm AB Living   2       1     SAB TAB Ectopic Multiple Live Births   1     1         Home Medications    Prior to Admission medications   Medication Sig Start Date End Date Taking? Authorizing Provider  Prenatal Vit-Fe Fumarate-FA (PRENATAL MULTIVITAMIN) TABS tablet Take 1 tablet by mouth daily at 12 noon.    [provider]  tobramycin (TOBREX) 0.3 % ophthalmic solution Place 2 drops into the left eye every 4 (four) hours. 06/29/17   Elson Areas, PA-C    Family History No family history on file.  Social History Social History  Substance Use Topics  . Smoking status: Former Smoker    Quit date: 08/11/2015  . Smokeless tobacco: Never Used  . Alcohol use No     Allergies   Patient has no known allergies.   Review of Systems Review of Systems All other systems negative except as documented in the HPI. All pertinent positives and negatives as reviewed in the HPI.  Physical Exam Updated Vital Signs BP (!) 142/102 (BP Location: Left Arm)   Pulse 72   Temp  98.3 F (36.8 C) (Oral)   Resp 20   Ht  (1.499 m)   Wt 97.5 kg (215 lb)   LMP 03/29/2016   SpO2 98%   BMI 43.42 kg/m   Physical Exam  Constitutional: She is oriented to person, place, and time. She appears well-developed and well-nourished. No distress.  HENT:  Head: Normocephalic and atraumatic.  Eyes: Pupils are equal, round, and reactive to light. Right conjunctiva is injected.  Pulmonary/Chest: Effort normal.  Neurological: She is alert and oriented to person, place, and time.  Skin: Skin is warm and dry.  Psychiatric: She has a normal mood and affect.  Nursing note and vitals reviewed.    ED Treatments / Results  Labs (all labs ordered are listed, but only abnormal results are displayed) Labs Reviewed - No data to display  EKG  EKG Interpretation None       Radiology No results found.  Procedures Procedures (including critical care time)  Medications Ordered in ED Medications - No data to display   Initial Impression / Assessment and Plan / ED Course  I have reviewed the triage vital signs and the nursing notes.  Pertinent labs & imaging results that were available during my  care of the patient were reviewed by me and considered in my medical decision making (see chart for details).     Patient be treated for conjunctivitis in the right eye.  Told to return here as needed.  Patient agrees the plan and all questions were answered Final Clinical Impressions(s) / ED Diagnoses   Final diagnoses:  None    New Prescriptions New Prescriptions   No medications on file     Charlestine Night, Cordelia Poche 07/05/17 0038    Vanetta Mulders, MD 07/13/17 [phone]

## 2017-07-01 NOTE — ED Triage Notes (Signed)
Pain in her right eye. States she was treated for pink eye in her left eye last week. She ran out of the eye drops.

## 2017-07-01 NOTE — Discharge Instructions (Signed)
Return here as needed. Follow up with a primary doctor. °

## 2018-05-27 ENCOUNTER — Encounter (HOSPITAL_BASED_OUTPATIENT_CLINIC_OR_DEPARTMENT_OTHER): Payer: Self-pay

## 2018-05-27 ENCOUNTER — Emergency Department (HOSPITAL_BASED_OUTPATIENT_CLINIC_OR_DEPARTMENT_OTHER)
Admission: EM | Admit: 2018-05-27 | Discharge: 2018-05-27 | Disposition: A | Discharge status: Home / Self Care | Payer: 59 | Attending: Emergency Medicine | Admitting: Emergency Medicine

## 2018-05-27 ENCOUNTER — Other Ambulatory Visit: Payer: Self-pay

## 2018-05-27 DIAGNOSIS — J45909 Unspecified asthma, uncomplicated: Secondary | ICD-10-CM | POA: Diagnosis not present

## 2018-05-27 DIAGNOSIS — N898 Other specified noninflammatory disorders of vagina: Secondary | ICD-10-CM | POA: Diagnosis present

## 2018-05-27 DIAGNOSIS — Z79899 Other long term (current) drug therapy: Secondary | ICD-10-CM | POA: Insufficient documentation

## 2018-05-27 DIAGNOSIS — B9689 Other specified bacterial agents as the cause of diseases classified elsewhere: Secondary | ICD-10-CM

## 2018-05-27 DIAGNOSIS — N760 Acute vaginitis: Secondary | ICD-10-CM | POA: Insufficient documentation

## 2018-05-27 DIAGNOSIS — Z87891 Personal history of nicotine dependence: Secondary | ICD-10-CM | POA: Insufficient documentation

## 2018-05-27 LAB — URINALYSIS, ROUTINE W REFLEX MICROSCOPIC
Bilirubin Urine: NEGATIVE
Glucose, UA: NEGATIVE mg/dL
HGB URINE DIPSTICK: NEGATIVE
KETONES UR: NEGATIVE mg/dL
Nitrite: NEGATIVE
PROTEIN: NEGATIVE mg/dL
Specific Gravity, Urine: 1.015 (ref 1.005–1.030)
pH: 7.5 (ref 5.0–8.0)

## 2018-05-27 LAB — URINALYSIS, MICROSCOPIC (REFLEX)

## 2018-05-27 LAB — WET PREP, GENITAL
Sperm: NONE SEEN
Trich, Wet Prep: NONE SEEN
Yeast Wet Prep HPF POC: NONE SEEN

## 2018-05-27 LAB — PREGNANCY, URINE: PREG TEST UR: NEGATIVE

## 2018-05-27 MED ORDER — METRONIDAZOLE 500 MG PO TABS: 500.0000 mg | ORAL_TABLET | Freq: Two times a day (BID) | ORAL | 0 refills | Status: AC

## 2018-05-27 NOTE — Discharge Instructions (Addendum)
Take Flagyl as prescribed until all gone.  Do not drink alcohol while taking this medication.  Do not use any scented products.  Follow-up as needed.

## 2018-05-27 NOTE — ED Triage Notes (Signed)
C/o vaginal d/c x 1 month-NAD-steady gait 

## 2018-05-27 NOTE — ED Provider Notes (Signed)
[facility] EMERGENCY DEPARTMENT Provider Note   CSN: [id number] Arrival date & time: 05/27/18  1332     History   Chief Complaint Chief Complaint  Patient presents with  . Vaginal Discharge    HPI Carol Willis is a 27 y.o. female.  HPI Carol Willis is a 27 y.o. female presents to emergency department complaining of vaginal odor and discharge for a month.  She states she has not been sexually active in 6 months.  She states that she has tried Summer Eve and switching soap to help with the smell.  She states she called her PCP and they gave her Diflucan tablets which did not help.  She does not douche.  She denies any recent antibiotics.  She has had bacterial vaginosis in the past and believes this might be what it is.  She had an appointment today with primary care doctor and states she was late by 30 minutes and they would not see her so she came here instead.  She denies any vaginal pain.  No vaginal itching.  She has an IUD.  She has no abdominal pain.  No fever or chills.  No urinary symptoms.  Past Medical History:  Diagnosis Date  . Asthma   . Multiple allergies     There are no active problems to display for this patient.   Past Surgical History:  Procedure Laterality Date  . WISDOM TOOTH EXTRACTION       OB History    Gravida  2   Para      Term      Preterm      AB  1   Living        SAB  1   TAB      Ectopic      Multiple  1   Live Births               Home Medications    Prior to Admission medications   Medication Sig Start Date End Date Taking? Authorizing Provider  olopatadine (PATANOL) 0.1 % ophthalmic solution Place 1 drop into the right eye 2 (two) times daily. 07/01/17   Lawyer, Cristal Deerhristopher, PA-C  Prenatal Vit-Fe Fumarate-FA (PRENATAL MULTIVITAMIN) TABS tablet Take 1 tablet by mouth daily at 12 noon.    [provider]  tobramycin (TOBREX) 0.3 % ophthalmic solution Place 2 drops into the right eye every  4 (four) hours. 07/01/17   Charlestine NightLawyer, Christopher, PA-C    Family History No family history on file.  Social History Social History   Tobacco Use  . Smoking status: Former Smoker    Last attempt to quit: 08/11/2015    Years since quitting: 2.7  . Smokeless tobacco: Never Used  Substance Use Topics  . Alcohol use: No  . Drug use: No    Comment: none current     Allergies   Patient has no known allergies.   Review of Systems Review of Systems  Constitutional: Negative for chills and fever.  Genitourinary: Positive for vaginal discharge. Negative for difficulty urinating, dysuria, frequency, pelvic pain and urgency.  Neurological: Negative for headaches.  All other systems reviewed and are negative.    Physical Exam Updated Vital Signs BP 126/88 (BP Location: Left Arm)   Pulse 74   Temp 98.6 F (37 C) (Oral)   Resp 18   Ht 4\' 11"  (1.499 m)   Wt 102.1 kg   SpO2 100%   BMI 45.44 kg/m   Physical Exam  Constitutional: She appears well-developed and well-nourished. No distress.  HENT:  Head: Normocephalic.  Eyes: Conjunctivae are normal.  Neck: Neck supple.  Cardiovascular: Normal rate, regular rhythm and normal heart sounds.  Pulmonary/Chest: Effort normal and breath sounds normal. No respiratory distress. She has no wheezes. She has no rales.  Abdominal: Soft. Bowel sounds are normal. She exhibits no distension. There is no tenderness. There is no rebound.  Genitourinary:  Genitourinary Comments: Normal external genitalia. Normal vaginal canal. Small thin white discharge. Cervix is normal, closed. No CMT. No uterine or adnexal tenderness. No masses palpated.    Musculoskeletal: She exhibits no edema.  Neurological: She is alert.  Skin: Skin is warm and dry.  Psychiatric: She has a normal mood and affect. Her behavior is normal.  Nursing note and vitals reviewed.    ED Treatments / Results  Labs (all labs ordered are listed, but only abnormal results are  displayed) Labs Reviewed  WET PREP, GENITAL  PREGNANCY, URINE  URINALYSIS, ROUTINE W REFLEX MICROSCOPIC  GC/CHLAMYDIA PROBE AMP ([place]) [facility]    EKG None  Radiology No results found.  Procedures Procedures (including critical care time)  Medications Ordered in ED Medications - No data to display   Initial Impression / Assessment and Plan / ED Course  I have reviewed the triage vital signs and the nursing notes.  Pertinent labs & imaging results that were available during my care of the patient were reviewed by me and considered in my medical decision making (see chart for details).     Pt in ED with vaginal discharge, no pain, no itching. WIll perform pelvic exam. preg test negative.   3:14 PM Pelvic exam is unremarkable.  Patient does have a white thin vaginal discharge.  No cervical motion tenderness or adnexal tenderness.  Wet prep shows clue cells and moderate WBCs.  GC chlamydia sent.  No yeast.  Will start on Flagyl.  Discussed not using any scented products or douching.  Patient agreed.  Follow-up with primary care doctor as needed.  Return precautions discussed.  Vitals:   05/27/18 1340  BP: 126/88  Pulse: 74  Resp: 18  Temp: 98.6 F (37 C)  SpO2: 100%     Final Clinical Impressions(s) / ED Diagnoses   Final diagnoses:  BV (bacterial vaginosis)    ED Discharge Orders         Ordered    metroNIDAZOLE (FLAGYL) 500 MG tablet  2 times daily     05/27/18 1515           Jaynie CrumbleKirichenko, Ledonna Dormer, PA-C 05/27/18 1516    Vanetta MuldersZackowski, Scott, MD 05/28/18 1622

## 2018-05-28 LAB — GC/CHLAMYDIA PROBE AMP (~~LOC~~) NOT AT ARMC
Chlamydia: NEGATIVE
Neisseria Gonorrhea: NEGATIVE

## 2018-07-29 ENCOUNTER — Emergency Department (HOSPITAL_BASED_OUTPATIENT_CLINIC_OR_DEPARTMENT_OTHER)
Admission: EM | Admit: 2018-07-29 | Discharge: 2018-07-29 | Disposition: A | Discharge status: Home / Self Care | Payer: 59 | Attending: Emergency Medicine | Admitting: Emergency Medicine

## 2018-07-29 ENCOUNTER — Encounter (HOSPITAL_BASED_OUTPATIENT_CLINIC_OR_DEPARTMENT_OTHER): Payer: Self-pay | Admitting: *Deleted

## 2018-07-29 ENCOUNTER — Other Ambulatory Visit: Payer: Self-pay

## 2018-07-29 ENCOUNTER — Emergency Department (HOSPITAL_BASED_OUTPATIENT_CLINIC_OR_DEPARTMENT_OTHER): Payer: 59

## 2018-07-29 DIAGNOSIS — J45909 Unspecified asthma, uncomplicated: Secondary | ICD-10-CM | POA: Diagnosis not present

## 2018-07-29 DIAGNOSIS — R05 Cough: Secondary | ICD-10-CM | POA: Diagnosis not present

## 2018-07-29 DIAGNOSIS — Z87891 Personal history of nicotine dependence: Secondary | ICD-10-CM | POA: Diagnosis not present

## 2018-07-29 DIAGNOSIS — R0602 Shortness of breath: Secondary | ICD-10-CM | POA: Diagnosis not present

## 2018-07-29 DIAGNOSIS — R059 Cough, unspecified: Secondary | ICD-10-CM

## 2018-07-29 DIAGNOSIS — J3489 Other specified disorders of nose and nasal sinuses: Secondary | ICD-10-CM | POA: Insufficient documentation

## 2018-07-29 DIAGNOSIS — J45901 Unspecified asthma with (acute) exacerbation: Secondary | ICD-10-CM | POA: Diagnosis not present

## 2018-07-29 DIAGNOSIS — J069 Acute upper respiratory infection, unspecified: Secondary | ICD-10-CM | POA: Diagnosis not present

## 2018-07-29 MED ORDER — PREDNISONE 50 MG PO TABS: 50.0000 mg | ORAL_TABLET | Freq: Every day | ORAL | 0 refills | Status: AC

## 2018-07-29 MED ORDER — ALBUTEROL SULFATE HFA 108 (90 BASE) MCG/ACT IN AERS
2.0000 | INHALATION_SPRAY | Freq: Once | RESPIRATORY_TRACT | Status: AC
  Administered 2018-07-29: 2 via RESPIRATORY_TRACT
  Filled 2018-07-29: qty 6.7

## 2018-07-29 NOTE — ED Notes (Signed)
Pt. Reports she started having cold type symptoms last Tuesday and now has a productive cough with green sputum and chest pain with cough.  Pt. Said she has history of asthma.

## 2018-07-29 NOTE — Discharge Instructions (Signed)
Your history and exam today are consistent with asthma exacerbation in the setting of viral upper respiratory infection.  The x-ray did not show bacterial pneumonia.  Given your improvement after breathing treatment, we feel you are safe for discharge home.  Please take steroids for the next 5 days and follow-up with your primary doctor.  If any symptoms change or worsen, please return to the nearest emergency department.

## 2018-07-29 NOTE — ED Provider Notes (Signed)
[facility] EMERGENCY DEPARTMENT Provider Note   CSN: [id number] Arrival date & time: 07/29/18  1332     History   Chief Complaint Chief Complaint  Patient presents with  . Cough    HPI Miamor Ayler is a 27 y.o. female.  The history is provided by the patient and medical records. No language interpreter was used.  Cough  This is a new problem. The current episode started more than 2 days ago. The problem occurs constantly. The problem has not changed since onset.The cough is productive of sputum. There has been no fever. Associated symptoms include rhinorrhea and shortness of breath. Pertinent negatives include no chest pain, no chills, no sweats, no headaches and no wheezing. She has tried nothing for the symptoms. The treatment provided no relief. Her past medical history is significant for asthma.    Past Medical History:  Diagnosis Date  . Asthma   . Multiple allergies     There are no active problems to display for this patient.   Past Surgical History:  Procedure Laterality Date  . WISDOM TOOTH EXTRACTION       OB History    Gravida  2   Para      Term      Preterm      AB  1   Living        SAB  1   TAB      Ectopic      Multiple  1   Live Births               Home Medications    Prior to Admission medications   Medication Sig Start Date End Date Taking? Authorizing Provider  metroNIDAZOLE (FLAGYL) 500 MG tablet Take 1 tablet (500 mg total) by mouth 2 (two) times daily. 05/27/18   Kirichenko, Tatyana, PA-C  olopatadine (PATANOL) 0.1 % ophthalmic solution Place 1 drop into the right eye 2 (two) times daily. 07/01/17   Lawyer, Cristal Deer, PA-C  Prenatal Vit-Fe Fumarate-FA (PRENATAL MULTIVITAMIN) TABS tablet Take 1 tablet by mouth daily at 12 noon.    [provider]  tobramycin (TOBREX) 0.3 % ophthalmic solution Place 2 drops into the right eye every 4 (four) hours. 07/01/17   Charlestine Night, PA-C    Family  History No family history on file.  Social History Social History   Tobacco Use  . Smoking status: Former Smoker    Last attempt to quit: 08/11/2015    Years since quitting: 2.9  . Smokeless tobacco: Never Used  Substance Use Topics  . Alcohol use: No  . Drug use: No    Comment: none current     Allergies   Patient has no known allergies.   Review of Systems Review of Systems  Constitutional: Negative for chills, diaphoresis, fatigue and fever.  HENT: Positive for congestion, rhinorrhea and sneezing.   Eyes: Negative for visual disturbance.  Respiratory: Positive for cough and shortness of breath. Negative for chest tightness, wheezing and stridor.   Cardiovascular: Negative for chest pain.  Gastrointestinal: Negative for abdominal distention and abdominal pain.  Genitourinary: Negative for dysuria, flank pain and frequency.  Musculoskeletal: Negative for back pain, neck pain and neck stiffness.  Skin: Negative for rash and wound.  Neurological: Negative for light-headedness and headaches.  Psychiatric/Behavioral: Negative for agitation.  All other systems reviewed and are negative.    Physical Exam Updated Vital Signs BP 108/69 (BP Location: Left Arm)   Pulse (!) 104  Temp 98.4 F (36.9 C) (Oral)   Resp 18   Ht 4\' 11"  (1.499 m)   Wt 102.1 kg   SpO2 100%   BMI 45.44 kg/m   Physical Exam  Constitutional: She appears well-developed and well-nourished. No distress.  HENT:  Head: Normocephalic and atraumatic.  Mouth/Throat: Oropharynx is clear and moist. No oropharyngeal exudate.  Eyes: Pupils are equal, round, and reactive to light. Conjunctivae and EOM are normal.  Neck: Neck supple.  Cardiovascular: Normal rate and regular rhythm.  No murmur heard. Pulmonary/Chest: Effort normal. No stridor. No tachypnea. No respiratory distress. She has no decreased breath sounds. She has wheezes. She has no rhonchi. She has no rales. She exhibits no tenderness.    Abdominal: Soft. There is no tenderness. There is no guarding.  Musculoskeletal: She exhibits no edema or tenderness.  Neurological: She is alert.  Skin: Skin is warm and dry. No rash noted. She is not diaphoretic. No erythema.  Psychiatric: She has a normal mood and affect.  Nursing note and vitals reviewed.    ED Treatments / Results  Labs (all labs ordered are listed, but only abnormal results are displayed) Labs Reviewed - No data to display  EKG None  Radiology Dg Chest 2 View  Result Date: 07/29/2018 CLINICAL DATA:  Cough, short of breath, congestion EXAM: CHEST - 2 VIEW COMPARISON:  None. FINDINGS: No active infiltrate or effusion is seen. Mediastinal and hilar contours are unremarkable. The heart is within normal limits in size. No bony abnormality is seen IMPRESSION: No active cardiopulmonary disease. Electronically Signed   By: Dwyane Dee M.D.   On: 07/29/2018 14:27    Procedures Procedures (including critical care time)  Medications Ordered in ED Medications  albuterol (PROVENTIL HFA;VENTOLIN HFA) 108 (90 Base) MCG/ACT inhaler 2 puff (2 puffs Inhalation Given 07/29/18 1419)     Initial Impression / Assessment and Plan / ED Course  I have reviewed the triage vital signs and the nursing notes.  Pertinent labs & imaging results that were available during my care of the patient were reviewed by me and considered in my medical decision making (see chart for details).     Madelein Mahadeo is a 27 y.o. female with past medical history significant for asthma who presents with rhinorrhea, congestion, cough, shortness of breath for the last week.  Patient reports that her daughter has had a cold recently and thinks she caught a virus from her daughter.  She reports that she has had this cough with a productive sputum for the last several days.  She denies fevers, chills, or chest pain.  She thinks her asthma has been acting up as she has been out of her inhalers for the  last few weeks.  She reports the environment with temperature changes is also affected her breathing.  She denies leg pain, leg swelling is no history of DVT or PE.  She otherwise has no complaints.    On exam, patient had faint wheezing.  No rhonchi.  No chest tenderness or back tenderness.  Abdomen nontender.  No lower semi-edema or swelling.  Patient on room air resting comfortably.    Clinically I suspect mild asthma exacerbation in the setting of viral URI given the known exposure and her congestion and cough.  To the mild tachycardia on arrival and the reactive cough, x-ray was obtained to look for pneumonia.    X-ray shows no evidence of pneumonia.  Patient will much better after albuterol treatment.  Patient will give  prescription for steroids for several days and will take the inhaler home.  Patient will follow-up with her PCP and understood return precautions for any new or worsened symptoms.    Patient no other questions or concerns and was discharged in good condition with resolved symptoms.   Final Clinical Impressions(s) / ED Diagnoses   Final diagnoses:  Cough  Upper respiratory tract infection, unspecified type  Exacerbation of asthma, unspecified asthma severity, unspecified whether persistent    ED Discharge Orders         Ordered    predniSONE (DELTASONE) 50 MG tablet  Daily     07/29/18 1435          Clinical Impression: 1. Cough   2. Upper respiratory tract infection, unspecified type   3. Exacerbation of asthma, unspecified asthma severity, unspecified whether persistent     Disposition: Discharge  Condition: Good  I have discussed the results, Dx and Tx plan with the pt(& family if present). He/she/they expressed understanding and agree(s) with the plan. Discharge instructions discussed at great length. Strict return precautions discussed and pt &/or family have verbalized understanding of the instructions. No further questions at time of discharge.     Discharge Medication List as of 07/29/2018  2:36 PM    START taking these medications   Details  predniSONE (DELTASONE) 50 MG tablet Take 1 tablet (50 mg total) by mouth daily., Starting Tue 07/29/2018, Print        Follow Up: [facility] [address] [place] [address] [phone] Schedule an appointment as soon as possible for a visit    [facility] [place] [facility] [address] [id number] [address] [place] [address] [phone]       Kasra Melvin, Canary Brim, MD 07/29/18 1524

## 2018-07-29 NOTE — ED Triage Notes (Signed)
Cough and congestion x 1 week 

## 2019-02-25 IMAGING — DX DG CHEST 2V
2 series · 2 of 2 positions shown · non-contrast
Comparison: None.

CLINICAL DATA: Cough, short of breath, congestion

EXAM:
CHEST - 2 VIEW

[chest pa]
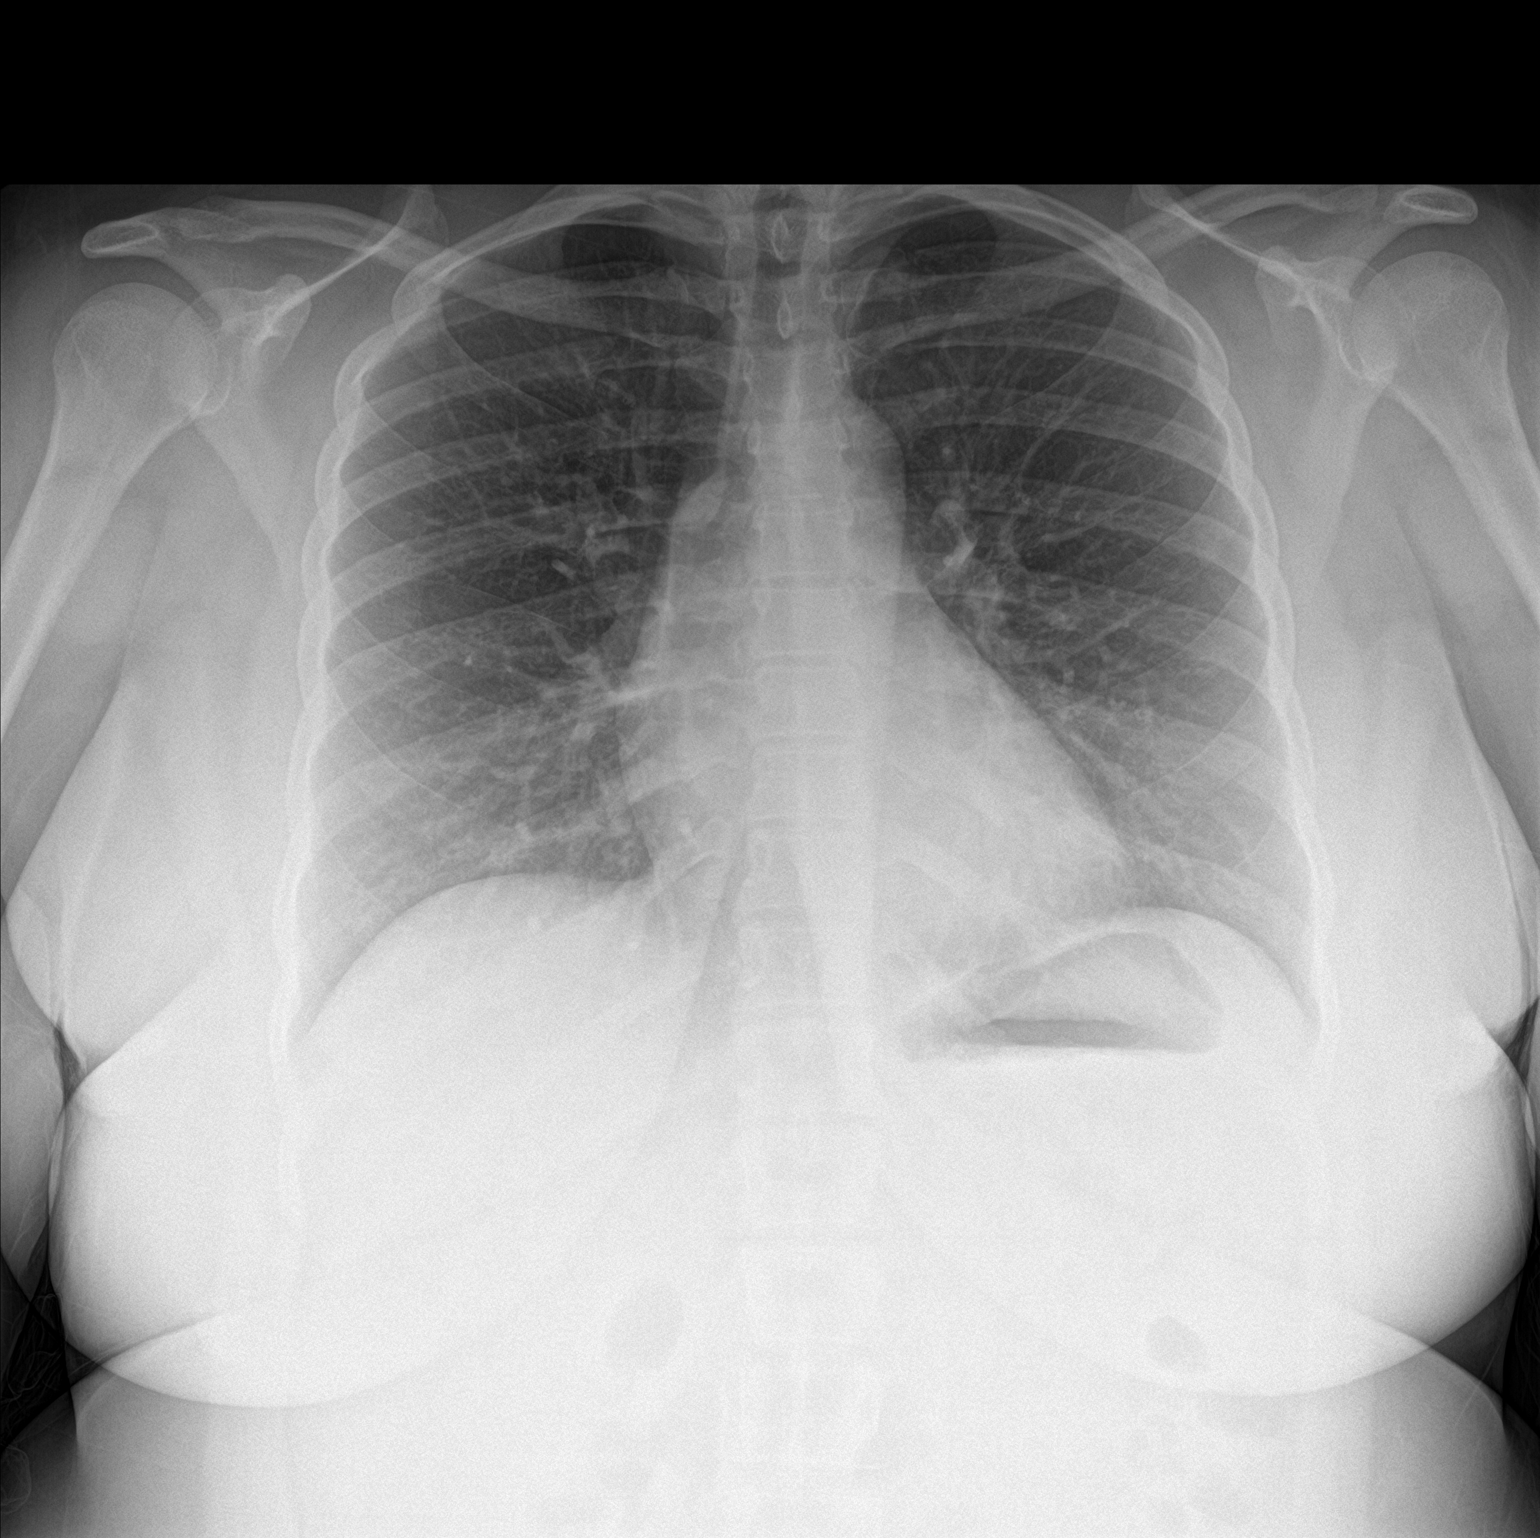

[chest lat]
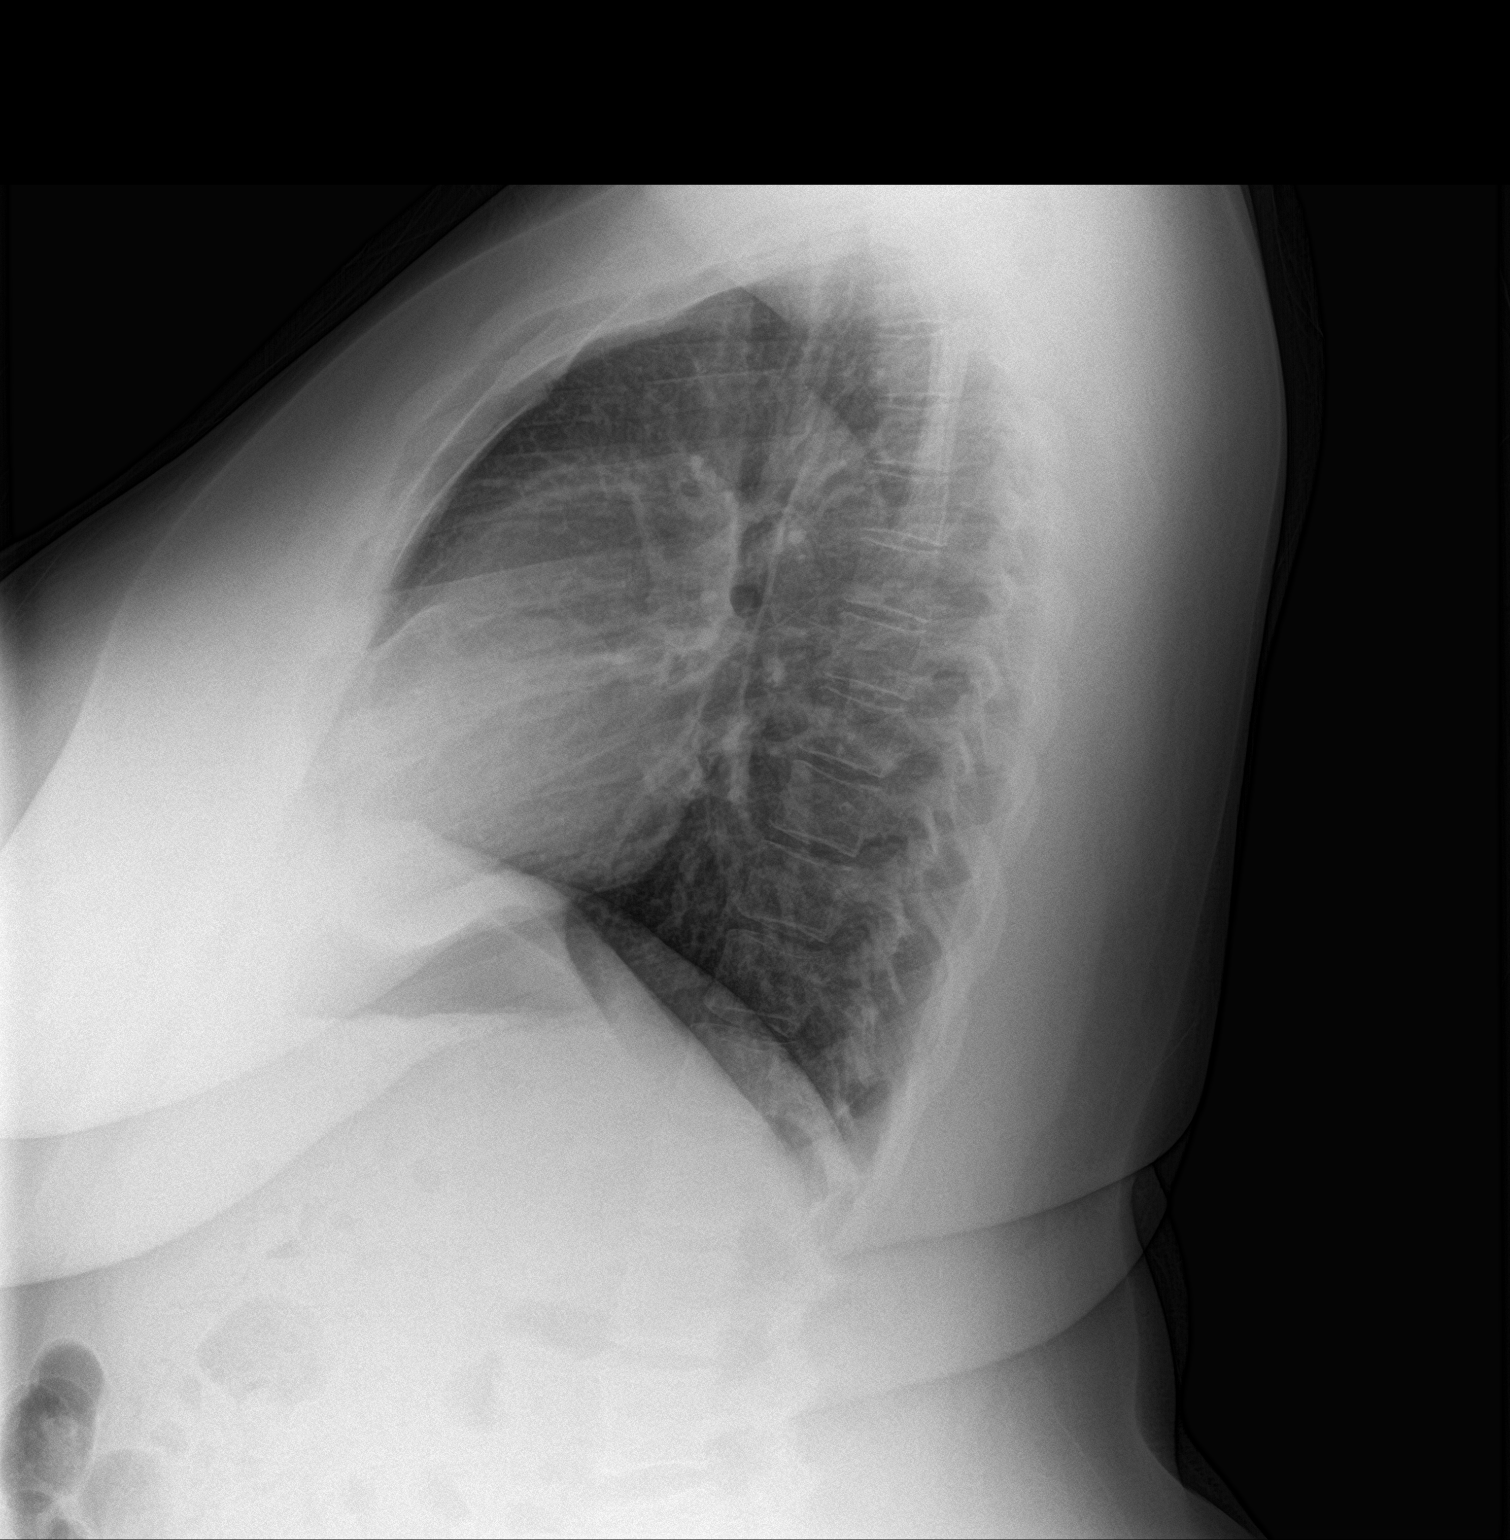

[2 of 2 positions shown; findings below may reference images not displayed]

FINDINGS: No active infiltrate or effusion is seen. Mediastinal and hilar
contours are unremarkable. The heart is within normal limits in
size. No bony abnormality is seen
IMPRESSION: No active cardiopulmonary disease.

## 2020-08-24 ENCOUNTER — Encounter: Payer: 59 | Admitting: Family Medicine
